# Patient Record
Sex: Female | Born: 1980 | Race: White | Hispanic: No | Marital: Married | Smoking: Never smoker
Health system: Southern US, Community
[De-identification: ages and names within clinical notes are randomized; demographics above are authoritative.]

## PROBLEM LIST (undated history)

## (undated) DIAGNOSIS — R112 Nausea with vomiting, unspecified: Secondary | ICD-10-CM

## (undated) DIAGNOSIS — Z9889 Other specified postprocedural states: Secondary | ICD-10-CM

## (undated) DIAGNOSIS — K429 Umbilical hernia without obstruction or gangrene: Secondary | ICD-10-CM

## (undated) DIAGNOSIS — F419 Anxiety disorder, unspecified: Secondary | ICD-10-CM

---

## 2008-10-13 ENCOUNTER — Encounter: Payer: Self-pay | Admitting: Obstetrics and Gynecology

## 2008-11-04 ENCOUNTER — Encounter: Payer: Self-pay | Admitting: Pediatric Cardiology

## 2009-01-12 ENCOUNTER — Encounter: Payer: Self-pay | Admitting: Maternal & Fetal Medicine

## 2009-02-05 ENCOUNTER — Encounter: Payer: Self-pay | Admitting: Obstetrics and Gynecology

## 2009-02-26 ENCOUNTER — Inpatient Hospital Stay: Payer: Self-pay

## 2010-11-13 ENCOUNTER — Inpatient Hospital Stay: Payer: Self-pay

## 2016-04-26 ENCOUNTER — Other Ambulatory Visit: Payer: Self-pay | Admitting: Medical Oncology

## 2016-04-26 DIAGNOSIS — R102 Pelvic and perineal pain: Secondary | ICD-10-CM

## 2016-04-29 ENCOUNTER — Ambulatory Visit
Admission: RE | Admit: 2016-04-29 | Discharge: 2016-04-29 | Disposition: A | Discharge status: Home / Self Care | Payer: 59 | Source: Ambulatory Visit | Attending: Medical Oncology | Admitting: Medical Oncology

## 2016-04-29 DIAGNOSIS — N83201 Unspecified ovarian cyst, right side: Secondary | ICD-10-CM | POA: Insufficient documentation

## 2016-04-29 DIAGNOSIS — D259 Leiomyoma of uterus, unspecified: Secondary | ICD-10-CM | POA: Diagnosis not present

## 2016-04-29 DIAGNOSIS — R102 Pelvic and perineal pain: Secondary | ICD-10-CM | POA: Diagnosis present

## 2016-09-05 HISTORY — PX: EYE SURGERY: SHX253

## 2018-07-13 ENCOUNTER — Encounter: Payer: Self-pay | Admitting: *Deleted

## 2018-07-13 ENCOUNTER — Other Ambulatory Visit: Payer: Self-pay

## 2018-07-13 ENCOUNTER — Encounter
Admission: RE | Admit: 2018-07-13 | Discharge: 2018-07-13 | Disposition: A | Discharge status: Home / Self Care | Payer: BLUE CROSS/BLUE SHIELD | Source: Ambulatory Visit | Attending: Obstetrics and Gynecology | Admitting: Obstetrics and Gynecology

## 2018-07-13 HISTORY — DX: Nausea with vomiting, unspecified: Z98.890

## 2018-07-13 HISTORY — DX: Umbilical hernia without obstruction or gangrene: K42.9

## 2018-07-13 HISTORY — DX: Nausea with vomiting, unspecified: R11.2

## 2018-07-13 NOTE — H&P (Signed)
Patient ID: Meghan Jacobson is a 37 y.o. female presenting with Pre Op Consulting  on 07/12/2018  HPI: 1. Postcoital bleeding:  - worsening bleeding over last 6 months, this month heavy clots, enough to stop intercourse. Improved this month. - Regular menses cycles, q23-25 days, heavy and painful - last pap 2019wnl- no hx of abnormal - Also dyspareunia, worsening over time. Feels like he is bumping something.  2. AUB: shortened cycles with heavy bleeding in first two days - bleeding through pads in <1hr, limited ability to leave the house  - condoms for contraception - 3cm fibroid noted on ultrasound - hx of limiting SE with OCPs - hx of IUD x3 with limiting side effects - nipple soreness and cystic acne - desires hysterectomy  4. Pelvic pain: - deep cramping - worse AFTER her period - Right lower quadrant pain  EMBx collected today and pending Last pap smear 3/19  Past Medical History:  has a past medical history of Strabismus and Umbilical hernia.  Past Surgical History:  has a past surgical history that includes strabismus eye surgery (1982~); wisdom teeth (2015); strabismus surgery w/placement adjustable suture (Left, 01/09/2017); strabismus surgery 2 horizontal muscles (Left, 01/09/2017); Eye surgery; and strabismus surgery superior oblique muscle (Left, 03/27/2017). Family History: family history includes High blood pressure (Hypertension) in her mother. Social History:  reports that she has never smoked. She has never used smokeless tobacco. She reports that she does not drink alcohol or use drugs. OB/GYN History:          OB History    Gravida  3   Para  3   Term  3   Preterm      AB      Living  3     SAB      TAB      Ectopic      Molar      Multiple      Live Births             Allergies: has No Known Allergies. Medications: No current outpatient medications on file.   Review of Systems: No SOB, no palpitations or chest pain, no  new lower extremity edema, no nausea or vomiting or bowel or bladder complaints. See HPI for gyn specific ROS.   Exam:   BP 110/66   Pulse 55   Ht 175.3 cm (5\' 9" )   Wt 71.3 kg (157 lb 3.2 oz)   BMI 23.21 kg/m   General: Patient is well-groomed, well-nourished, appears stated age in no acute distress  HEENT: head is atraumatic and normocephalic, trachea is midline, neck is supple with no palpable nodules  CV: Regular rhythm and normal heart rate, no murmur  Pulm: Clear to auscultation throughout lung fields with no wheezing, crackles, or rhonchi. No increased work of breathing  Abdomen: soft , no mass, non-tender, no rebound tenderness, no hepatomegaly  Pelvic: tanner stage 5 ,              External genitalia: vulva /labia no lesions             Urethra: no prolapse             Vagina: normal physiologic d/c, laxity in vaginal walls             Cervix: no lesions, no cervical motion tenderness, good descent             Uterus: normal size shape and contour, non-tender  Adnexa: no mass,  non-tender               Rectovaginal: External wnl   Impression:   The encounter diagnosis was Excessive or frequent menstruation.    Plan:    Patient returns for a preoperative discussion regarding her plans to proceed with surgical treatment of her pain and bleeding by total laparoscopic hysterectomy with bilateral salpingectomy  procedure. We will perform a cystoscopy to evaluate the urinary tract after the procedure.   The patient and I discussed the technical aspects of the procedure including the potential for risks and complications. These include but are not limited to the risk of infection requiring post-operative antibiotics or further procedures. We talked about the risk of injury to adjacent organs including bladder, bowel, ureter, blood vessels or nerves. We talked about the need to convert to an open incision. We talked about the possible need  for blood transfusion. We talked aboutpostop complications such asthromboembolic or cardiopulmonary complications. All of her questions were answered.  Her preoperative exam was completed and the appropriate consents were signed. She is scheduled to undergo this procedure in the near future.  Specific Peri-operative Considerations:  - Consent: obtained today - Health Maintenance: up todate - Labs: CBC, CMP preoperatively - Studies: EKG, CXR preoperatively - Bowel Preparation: None required - Abx:  Ancef 2g - VTE ppx: SCDs perioperatively - Glucose Protocol: n/a - Beta-blockade: n/a

## 2018-07-13 NOTE — Patient Instructions (Signed)
Your procedure is scheduled on: 07-20-18 FRIDAY Report to Same Day Surgery 2nd floor medical mall [facility] Entrance-take elevator on left to 2nd floor.  Check in with surgery information desk.) To find out your arrival time please call [phone] between 1PM - 3PM on 07-19-18 THURSDAY  Remember: Instructions that are not followed completely may result in serious medical risk, up to and including death, or upon the discretion of your surgeon and anesthesiologist your surgery may need to be rescheduled.    _x___ 1. Do not eat food after midnight the night before your procedure. NO GUM OR CANDY AFTER MIDNIGHT. You may drink clear liquids up to 2 hours before you are scheduled to arrive at the hospital for your procedure.  Do not drink clear liquids within 2 hours of your scheduled arrival to the hospital.  Clear liquids include  --Water or Apple juice without pulp  --Clear carbohydrate beverage such as ClearFast or Gatorade  --Black Coffee or Clear Tea (No milk, no creamers, do not add anything to the coffee or Tea   ____Ensure clear carbohydrate drink on the way to the hospital for bariatric patients  ____Ensure clear carbohydrate drink 3 hours before surgery for Dr Dwyane Luo patients if physician instructed.     __x__ 2. No Alcohol for 24 hours before or after surgery.   __x__3. No Smoking or e-cigarettes for 24 prior to surgery.  Do not use any chewable tobacco products for at least 6 hour prior to surgery   ____  4. Bring all medications with you on the day of surgery if instructed.    __x__ 5. Notify your doctor if there is any change in your medical condition     (cold, fever, infections).    x___6. On the morning of surgery brush your teeth with toothpaste and water.  You may rinse your mouth with mouth wash if you wish.  Do not swallow any toothpaste or mouthwash.   Do not wear jewelry, make-up, hairpins, clips or nail polish.  Do not wear lotions, powders, or perfumes.  You may wear deodorant.  Do not shave 48 hours prior to surgery. Men may shave face and neck.  Do not bring valuables to the hospital.    [facility] is not responsible for any belongings or valuables.               Contacts, dentures or bridgework may not be worn into surgery.  Leave your suitcase in the car. After surgery it may be brought to your room.  For patients admitted to the hospital, discharge time is determined by your treatment team.  _  Patients discharged the day of surgery will not be allowed to drive home.  You will need someone to drive you home and stay with you the night of your procedure.    Please read over the following fact sheets that you were given:   [facility] Preparing for Surgery   ____ Take anti-hypertensive listed below, cardiac, seizure, asthma, anti-reflux and psychiatric medicines. These include:  1. NONE  2.  3.  4.  5.  6.  ____Fleets enema or Magnesium Citrate as directed.   _x___ Use CHG Soap or sage wipes as directed on instruction sheet   ____ Use inhalers on the day of surgery and bring to hospital day of surgery  ____ Stop Metformin and Janumet 2 days prior to surgery.    ____ Take 1/2 of usual insulin dose the night before surgery and none on  the morning surgery.   ____ Follow recommendations from Cardiologist, Pulmonologist or PCP regarding stopping Aspirin, Coumadin, Plavix ,Eliquis, Effient, or Pradaxa, and Pletal.  X____Stop Anti-inflammatories such as Advil, Aleve, Ibuprofen, Motrin, Naproxen, Naprosyn, Goodies powders or aspirin products NOW-OK to take Tylenol    ____ Stop supplements until after surgery.    ____ Bring C-Pap to the hospital.

## 2018-07-16 ENCOUNTER — Encounter
Admission: RE | Admit: 2018-07-16 | Discharge: 2018-07-16 | Disposition: A | Discharge status: Home / Self Care | Payer: BLUE CROSS/BLUE SHIELD | Source: Ambulatory Visit | Attending: Obstetrics and Gynecology | Admitting: Obstetrics and Gynecology

## 2018-07-16 DIAGNOSIS — N920 Excessive and frequent menstruation with regular cycle: Secondary | ICD-10-CM | POA: Diagnosis not present

## 2018-07-16 DIAGNOSIS — Z01812 Encounter for preprocedural laboratory examination: Secondary | ICD-10-CM | POA: Insufficient documentation

## 2018-07-16 LAB — BASIC METABOLIC PANEL
ANION GAP: 8 (ref 5–15)
BUN: 7 mg/dL (ref 6–20)
CHLORIDE: 104 mmol/L (ref 98–111)
CO2: 26 mmol/L (ref 22–32)
Calcium: 8.6 mg/dL — ABNORMAL LOW (ref 8.9–10.3)
Creatinine, Ser: 0.6 mg/dL (ref 0.44–1.00)
GFR calc Af Amer: >60 mL/min (ref >60)
GLUCOSE: 84 mg/dL (ref 70–99)
POTASSIUM: 4.4 mmol/L (ref 3.5–5.1)
Sodium: 138 mmol/L (ref 135–145)

## 2018-07-16 LAB — CBC
HEMATOCRIT: 40.2 % (ref 36.0–46.0)
HEMOGLOBIN: 13.1 g/dL (ref 12.0–15.0)
MCH: 29.7 pg (ref 26.0–34.0)
MCHC: 32.6 g/dL (ref 30.0–36.0)
MCV: 91.2 fL (ref 80.0–100.0)
Platelets: 237 10*3/uL (ref 150–400)
RBC: 4.41 MIL/uL (ref 3.87–5.11)
RDW: 13.1 % (ref 11.5–15.5)
WBC: 4.8 10*3/uL (ref 4.0–10.5)
nRBC: 0 % (ref 0.0–0.2)

## 2018-07-16 LAB — TYPE AND SCREEN
ABO/RH(D): O POS
ANTIBODY SCREEN: NEGATIVE

## 2018-07-19 MED ORDER — CEFAZOLIN SODIUM-DEXTROSE 2-4 GM/100ML-% IV SOLN
2.0000 g | INTRAVENOUS | Status: AC
  Administered 2018-07-20: 2 g via INTRAVENOUS

## 2018-07-20 ENCOUNTER — Ambulatory Visit
Admission: RE | Admit: 2018-07-20 | Discharge: 2018-07-20 | Disposition: A | Discharge status: Home / Self Care | Payer: BLUE CROSS/BLUE SHIELD | Source: Ambulatory Visit | Attending: Obstetrics and Gynecology | Admitting: Obstetrics and Gynecology

## 2018-07-20 ENCOUNTER — Encounter: Payer: Self-pay | Admitting: *Deleted

## 2018-07-20 ENCOUNTER — Encounter
Admission: RE | Disposition: A | Discharge status: Home / Self Care | Payer: Self-pay | Source: Ambulatory Visit | Attending: Obstetrics and Gynecology

## 2018-07-20 ENCOUNTER — Ambulatory Visit: Payer: BLUE CROSS/BLUE SHIELD | Admitting: Anesthesiology

## 2018-07-20 DIAGNOSIS — N930 Postcoital and contact bleeding: Secondary | ICD-10-CM | POA: Diagnosis not present

## 2018-07-20 DIAGNOSIS — N920 Excessive and frequent menstruation with regular cycle: Secondary | ICD-10-CM | POA: Insufficient documentation

## 2018-07-20 DIAGNOSIS — N838 Other noninflammatory disorders of ovary, fallopian tube and broad ligament: Secondary | ICD-10-CM | POA: Insufficient documentation

## 2018-07-20 DIAGNOSIS — Z8249 Family history of ischemic heart disease and other diseases of the circulatory system: Secondary | ICD-10-CM | POA: Diagnosis not present

## 2018-07-20 DIAGNOSIS — D251 Intramural leiomyoma of uterus: Secondary | ICD-10-CM | POA: Insufficient documentation

## 2018-07-20 DIAGNOSIS — D252 Subserosal leiomyoma of uterus: Secondary | ICD-10-CM | POA: Insufficient documentation

## 2018-07-20 HISTORY — PX: LAPAROSCOPIC HYSTERECTOMY: SHX1926

## 2018-07-20 HISTORY — PX: LAPAROSCOPIC BILATERAL SALPINGECTOMY: SHX5889

## 2018-07-20 HISTORY — DX: Anxiety disorder, unspecified: F41.9

## 2018-07-20 LAB — POCT PREGNANCY, URINE: Preg Test, Ur: NEGATIVE

## 2018-07-20 LAB — ABO/RH: ABO/RH(D): O POS

## 2018-07-20 SURGERY — HYSTERECTOMY, TOTAL, LAPAROSCOPIC
Anesthesia: General

## 2018-07-20 MED ORDER — FENTANYL CITRATE (PF) 100 MCG/2ML IJ SOLN
INTRAMUSCULAR | Status: DC | PRN
  Administered 2018-07-20 (×3): 50 ug via INTRAVENOUS

## 2018-07-20 MED ORDER — FAMOTIDINE 20 MG PO TABS
ORAL_TABLET | ORAL | Status: AC
  Filled 2018-07-20: qty 1

## 2018-07-20 MED ORDER — GABAPENTIN 300 MG PO CAPS
ORAL_CAPSULE | ORAL | Status: AC
  Administered 2018-07-20: 300 mg via ORAL
  Filled 2018-07-20: qty 1

## 2018-07-20 MED ORDER — ONDANSETRON HCL 4 MG/2ML IJ SOLN
INTRAMUSCULAR | Status: DC | PRN
  Administered 2018-07-20: 4 mg via INTRAVENOUS

## 2018-07-20 MED ORDER — GLYCOPYRROLATE 0.2 MG/ML IJ SOLN
INTRAMUSCULAR | Status: AC
  Filled 2018-07-20: qty 1

## 2018-07-20 MED ORDER — LACTATED RINGERS IV SOLN
INTRAVENOUS | Status: DC
  Administered 2018-07-20: 50 mL/h via INTRAVENOUS

## 2018-07-20 MED ORDER — DEXAMETHASONE SODIUM PHOSPHATE 10 MG/ML IJ SOLN
INTRAMUSCULAR | Status: AC
  Filled 2018-07-20: qty 1

## 2018-07-20 MED ORDER — BUPIVACAINE HCL 0.5 % IJ SOLN
INTRAMUSCULAR | Status: DC | PRN
  Administered 2018-07-20: 10 mL

## 2018-07-20 MED ORDER — CELECOXIB 200 MG PO CAPS
ORAL_CAPSULE | ORAL | Status: AC
  Administered 2018-07-20: 400 mg via ORAL
  Filled 2018-07-20: qty 2

## 2018-07-20 MED ORDER — FAMOTIDINE 20 MG PO TABS: 20.0000 mg | ORAL_TABLET | Freq: Once | ORAL | Status: DC

## 2018-07-20 MED ORDER — SUCCINYLCHOLINE CHLORIDE 20 MG/ML IJ SOLN
INTRAMUSCULAR | Status: AC
  Filled 2018-07-20: qty 2

## 2018-07-20 MED ORDER — MIDAZOLAM HCL 2 MG/2ML IJ SOLN
INTRAMUSCULAR | Status: AC
  Filled 2018-07-20: qty 2

## 2018-07-20 MED ORDER — MIDAZOLAM HCL 2 MG/2ML IJ SOLN
INTRAMUSCULAR | Status: DC | PRN
  Administered 2018-07-20: 2 mg via INTRAVENOUS

## 2018-07-20 MED ORDER — FENTANYL CITRATE (PF) 100 MCG/2ML IJ SOLN: 25.0000 ug | INTRAMUSCULAR | Status: DC | PRN

## 2018-07-20 MED ORDER — DOCUSATE SODIUM 100 MG PO CAPS: 100.0000 mg | ORAL_CAPSULE | Freq: Two times a day (BID) | ORAL | 0 refills | Status: DC

## 2018-07-20 MED ORDER — ONDANSETRON HCL 4 MG/2ML IJ SOLN: 4.0000 mg | Freq: Once | INTRAMUSCULAR | Status: DC | PRN

## 2018-07-20 MED ORDER — IBUPROFEN 800 MG PO TABS: 800.0000 mg | ORAL_TABLET | Freq: Three times a day (TID) | ORAL | 1 refills | Status: AC

## 2018-07-20 MED ORDER — SUGAMMADEX SODIUM 200 MG/2ML IV SOLN
INTRAVENOUS | Status: AC
  Filled 2018-07-20: qty 2

## 2018-07-20 MED ORDER — ROCURONIUM BROMIDE 50 MG/5ML IV SOLN
INTRAVENOUS | Status: AC
  Filled 2018-07-20: qty 1

## 2018-07-20 MED ORDER — LIDOCAINE HCL (PF) 2 % IJ SOLN
INTRAMUSCULAR | Status: AC
  Filled 2018-07-20: qty 10

## 2018-07-20 MED ORDER — OXYCODONE HCL 5 MG PO CAPS: 5.0000 mg | ORAL_CAPSULE | Freq: Four times a day (QID) | ORAL | 0 refills | Status: DC | PRN

## 2018-07-20 MED ORDER — DEXAMETHASONE SODIUM PHOSPHATE 10 MG/ML IJ SOLN
INTRAMUSCULAR | Status: DC | PRN
  Administered 2018-07-20: 10 mg via INTRAVENOUS

## 2018-07-20 MED ORDER — ACETAMINOPHEN 500 MG PO TABS
ORAL_TABLET | ORAL | Status: AC
  Administered 2018-07-20: 1000 mg via ORAL
  Filled 2018-07-20: qty 2

## 2018-07-20 MED ORDER — BUPIVACAINE HCL (PF) 0.5 % IJ SOLN
INTRAMUSCULAR | Status: AC
  Filled 2018-07-20: qty 30

## 2018-07-20 MED ORDER — ACETAMINOPHEN 500 MG PO TABS: 1000.0000 mg | ORAL_TABLET | Freq: Four times a day (QID) | ORAL | 0 refills | Status: AC

## 2018-07-20 MED ORDER — ROCURONIUM BROMIDE 100 MG/10ML IV SOLN
INTRAVENOUS | Status: DC | PRN
  Administered 2018-07-20: 20 mg via INTRAVENOUS
  Administered 2018-07-20: 50 mg via INTRAVENOUS

## 2018-07-20 MED ORDER — IBUPROFEN 800 MG PO TABS
800.0000 mg | ORAL_TABLET | Freq: Three times a day (TID) | ORAL | Status: DC | PRN
  Administered 2018-07-20: 800 mg via ORAL
  Filled 2018-07-20 (×2): qty 1

## 2018-07-20 MED ORDER — SUGAMMADEX SODIUM 200 MG/2ML IV SOLN
INTRAVENOUS | Status: DC | PRN
  Administered 2018-07-20: 200 mg via INTRAVENOUS

## 2018-07-20 MED ORDER — PROPOFOL 10 MG/ML IV BOLUS
INTRAVENOUS | Status: DC | PRN
  Administered 2018-07-20: 160 mg via INTRAVENOUS

## 2018-07-20 MED ORDER — LIDOCAINE HCL (CARDIAC) PF 100 MG/5ML IV SOSY
PREFILLED_SYRINGE | INTRAVENOUS | Status: DC | PRN
  Administered 2018-07-20: 50 mg via INTRAVENOUS

## 2018-07-20 MED ORDER — ACETAMINOPHEN 500 MG PO TABS
1000.0000 mg | ORAL_TABLET | ORAL | Status: AC
  Administered 2018-07-20: 1000 mg via ORAL

## 2018-07-20 MED ORDER — IBUPROFEN 800 MG PO TABS
ORAL_TABLET | ORAL | Status: AC
  Filled 2018-07-20: qty 1

## 2018-07-20 MED ORDER — ONDANSETRON HCL 4 MG/2ML IJ SOLN
INTRAMUSCULAR | Status: AC
  Filled 2018-07-20: qty 2

## 2018-07-20 MED ORDER — GABAPENTIN 800 MG PO TABS: 800.0000 mg | ORAL_TABLET | Freq: Every day | ORAL | 0 refills | Status: AC

## 2018-07-20 MED ORDER — LACTATED RINGERS IV SOLN
INTRAVENOUS | Status: DC
  Administered 2018-07-20: 08:00:00 via INTRAVENOUS

## 2018-07-20 MED ORDER — PROPOFOL 10 MG/ML IV BOLUS
INTRAVENOUS | Status: AC
  Filled 2018-07-20: qty 20

## 2018-07-20 MED ORDER — GABAPENTIN 300 MG PO CAPS
300.0000 mg | ORAL_CAPSULE | ORAL | Status: AC
  Administered 2018-07-20: 300 mg via ORAL

## 2018-07-20 MED ORDER — CELECOXIB 200 MG PO CAPS
400.0000 mg | ORAL_CAPSULE | ORAL | Status: AC
  Administered 2018-07-20: 400 mg via ORAL

## 2018-07-20 MED ORDER — GLYCOPYRROLATE 0.2 MG/ML IJ SOLN
INTRAMUSCULAR | Status: DC | PRN
  Administered 2018-07-20: 0.2 mg via INTRAVENOUS

## 2018-07-20 MED ORDER — FENTANYL CITRATE (PF) 250 MCG/5ML IJ SOLN
INTRAMUSCULAR | Status: AC
  Filled 2018-07-20: qty 5

## 2018-07-20 MED ORDER — CEFAZOLIN SODIUM-DEXTROSE 2-4 GM/100ML-% IV SOLN
INTRAVENOUS | Status: AC
  Filled 2018-07-20: qty 100

## 2018-07-20 SURGICAL SUPPLY — 65 items
BAG URINE DRAINAGE (UROLOGICAL SUPPLIES) ×8 IMPLANT
BLADE SURG SZ11 CARB STEEL (BLADE) ×4 IMPLANT
CATH FOLEY 2WAY  5CC 16FR (CATHETERS) ×2
CATH ROBINSON RED A/P 16FR (CATHETERS) ×4 IMPLANT
CATH URTH 16FR FL 2W BLN LF (CATHETERS) ×2 IMPLANT
CHLORAPREP W/TINT 26ML (MISCELLANEOUS) ×4 IMPLANT
CLOSURE WOUND 1/4X4 (GAUZE/BANDAGES/DRESSINGS) ×1
CORD MONOPOLAR M/FML 12FT (MISCELLANEOUS) ×4 IMPLANT
COUNTER NEEDLE 20/40 LG (NEEDLE) ×4 IMPLANT
COVER LIGHT HANDLE STERIS (MISCELLANEOUS) ×8 IMPLANT
COVER WAND RF STERILE (DRAPES) ×4 IMPLANT
DERMABOND ADVANCED (GAUZE/BANDAGES/DRESSINGS) ×2
DERMABOND ADVANCED .7 DNX12 (GAUZE/BANDAGES/DRESSINGS) ×2 IMPLANT
DEVICE SUTURE ENDOST 10MM (ENDOMECHANICALS) ×8 IMPLANT
DRAPE GENERAL ENDO 106X123.5 (DRAPES) ×4 IMPLANT
DRAPE STERI POUCH LG 24X46 STR (DRAPES) ×4 IMPLANT
DRSG TEGADERM 2-3/8X2-3/4 SM (GAUZE/BANDAGES/DRESSINGS) ×12 IMPLANT
GLOVE BIO SURGEON STRL SZ7 (GLOVE) ×12 IMPLANT
GLOVE INDICATOR 7.5 STRL GRN (GLOVE) ×4 IMPLANT
GOWN STRL REUS W/ TWL LRG LVL3 (GOWN DISPOSABLE) ×4 IMPLANT
GOWN STRL REUS W/ TWL XL LVL3 (GOWN DISPOSABLE) ×2 IMPLANT
GOWN STRL REUS W/TWL LRG LVL3 (GOWN DISPOSABLE) ×4
GOWN STRL REUS W/TWL XL LVL3 (GOWN DISPOSABLE) ×2
GRASPER SUT TROCAR 14GX15 (MISCELLANEOUS) ×4 IMPLANT
IRRIGATION STRYKERFLOW (MISCELLANEOUS) ×2 IMPLANT
IRRIGATOR STRYKERFLOW (MISCELLANEOUS) ×4
IV LACTATED RINGERS 1000ML (IV SOLUTION) ×4 IMPLANT
IV NS 1000ML (IV SOLUTION) ×2
IV NS 1000ML BAXH (IV SOLUTION) ×2 IMPLANT
KIT PINK PAD W/HEAD ARE REST (MISCELLANEOUS) ×4
KIT PINK PAD W/HEAD ARM REST (MISCELLANEOUS) ×2 IMPLANT
KIT TURNOVER CYSTO (KITS) ×4 IMPLANT
LABEL OR SOLS (LABEL) ×4 IMPLANT
LIGASURE VESSEL 5MM BLUNT TIP (ELECTROSURGICAL) ×4 IMPLANT
MANIPULATOR VCARE LG CRV RETR (MISCELLANEOUS) IMPLANT
MANIPULATOR VCARE SML CRV RETR (MISCELLANEOUS) IMPLANT
MANIPULATOR VCARE STD CRV RETR (MISCELLANEOUS) ×4 IMPLANT
NS IRRIG 500ML POUR BTL (IV SOLUTION) ×4 IMPLANT
OCCLUDER COLPOPNEUMO (BALLOONS) ×4 IMPLANT
PACK GYN LAPAROSCOPIC (MISCELLANEOUS) ×4 IMPLANT
PAD OB MATERNITY 4.3X12.25 (PERSONAL CARE ITEMS) ×4 IMPLANT
PAD PREP 24X41 OB/GYN DISP (PERSONAL CARE ITEMS) ×4 IMPLANT
POUCH SPECIMEN RETRIEVAL 10MM (ENDOMECHANICALS) IMPLANT
SCISSORS METZENBAUM CVD 33 (INSTRUMENTS) ×4 IMPLANT
SET CYSTO W/LG BORE CLAMP LF (SET/KITS/TRAYS/PACK) ×4 IMPLANT
SLEEVE ENDOPATH XCEL 5M (ENDOMECHANICALS) ×4 IMPLANT
SPONGE GAUZE 2X2 8PLY STER LF (GAUZE/BANDAGES/DRESSINGS) ×2
SPONGE GAUZE 2X2 8PLY STRL LF (GAUZE/BANDAGES/DRESSINGS) ×6 IMPLANT
STRIP CLOSURE SKIN 1/4X4 (GAUZE/BANDAGES/DRESSINGS) ×3 IMPLANT
SURGILUBE 2OZ TUBE FLIPTOP (MISCELLANEOUS) ×4 IMPLANT
SUT ENDO VLOC 180-0-8IN (SUTURE) ×8 IMPLANT
SUT MNCRL 4-0 (SUTURE) ×2
SUT MNCRL 4-0 27XMFL (SUTURE) ×2
SUT MNCRL AB 4-0 PS2 18 (SUTURE) ×4 IMPLANT
SUT VIC AB 0 CT1 36 (SUTURE) ×8 IMPLANT
SUT VIC AB 2-0 UR6 27 (SUTURE) ×4 IMPLANT
SUT VIC AB 4-0 SH 27 (SUTURE) ×2
SUT VIC AB 4-0 SH 27XANBCTRL (SUTURE) ×2 IMPLANT
SUTURE MNCRL 4-0 27XMF (SUTURE) ×2 IMPLANT
SYR 10ML LL (SYRINGE) ×4 IMPLANT
SYR 50ML LL SCALE MARK (SYRINGE) ×4 IMPLANT
TROCAR ENDO BLADELESS 11MM (ENDOMECHANICALS) IMPLANT
TROCAR XCEL NON-BLD 5MMX100MML (ENDOMECHANICALS) ×4 IMPLANT
TUBING INSUF HEATED (TUBING) ×4 IMPLANT
TUBING INSUFFLATION (TUBING) ×4 IMPLANT

## 2018-07-20 NOTE — Anesthesia Preprocedure Evaluation (Signed)
Anesthesia Evaluation  Patient identified by MRN, date of birth, ID band Patient awake    Reviewed: Allergy & Precautions, NPO status , Patient's Chart, lab work & pertinent test results  History of Anesthesia Complications (+) PONV and history of anesthetic complications  Airway Mallampati: II  TM Distance: >3 FB     Dental   Pulmonary neg pulmonary ROS,    Pulmonary exam normal        Cardiovascular negative cardio ROS Normal cardiovascular exam     Neuro/Psych Anxiety negative neurological ROS     GI/Hepatic negative GI ROS, Neg liver ROS,   Endo/Other  negative endocrine ROS  Renal/GU negative Renal ROS  Female GU complaint     Musculoskeletal negative musculoskeletal ROS (+)   Abdominal Normal abdominal exam  (+)   Peds negative pediatric ROS (+)  Hematology negative hematology ROS (+)   Anesthesia Other Findings   Reproductive/Obstetrics                             Anesthesia Physical Anesthesia Plan  ASA: I  Anesthesia Plan: General   Post-op Pain Management:    Induction: Intravenous  PONV Risk Score and Plan:   Airway Management Planned: Oral ETT  Additional Equipment:   Intra-op Plan:   Post-operative Plan: Extubation in OR  Informed Consent: I have reviewed the patients History and Physical, chart, labs and discussed the procedure including the risks, benefits and alternatives for the proposed anesthesia with the patient or authorized representative who has indicated his/her understanding and acceptance.   Dental advisory given  Plan Discussed with: CRNA and Surgeon  Anesthesia Plan Comments:         Anesthesia Quick Evaluation

## 2018-07-20 NOTE — Interval H&P Note (Signed)
History and Physical Interval Note:  07/20/2018 7:29 AM  Meghan Jacobson  has presented today for surgery, with the diagnosis of menorrhagia, pelvic pain  The various methods of treatment have been discussed with the patient and family. After consideration of risks, benefits and other options for treatment, the patient has consented to  Procedure(s): HYSTERECTOMY TOTAL LAPAROSCOPIC (N/A) LAPAROSCOPIC BILATERAL SALPINGECTOMY (Bilateral) CYSTOSCOPY (N/A) as a surgical intervention .  The patient's history has been reviewed, patient examined, no change in status, stable for surgery.  I have reviewed the patient's chart and labs.  Questions were answered to the patient's satisfaction.    Her endometrial biopsy showed chronic endometritis. Pap smear up to date.    Benjaman Kindler

## 2018-07-20 NOTE — OR Nursing (Deleted)
Patient reports taking her antibiotic Monday through Wednesday but did not take it at all yesterday because she felt it was making her nauseated. Dr. Diamantina Providence notified and UA ordered. UA sent stat to lab.

## 2018-07-20 NOTE — Anesthesia Procedure Notes (Signed)
Procedure Name: Intubation Performed by: Rolla Plate, CRNA Pre-anesthesia Checklist: Patient identified, Patient being monitored, Timeout performed, Emergency Drugs available and Suction available Patient Re-evaluated:Patient Re-evaluated prior to induction Oxygen Delivery Method: Circle system utilized Preoxygenation: Pre-oxygenation with 100% oxygen Induction Type: IV induction Ventilation: Mask ventilation without difficulty Laryngoscope Size: Mac and 3 Grade View: Grade I Tube type: Oral Tube size: 7.0 mm Number of attempts: 1 Airway Equipment and Method: Stylet Placement Confirmation: ETT inserted through vocal cords under direct vision,  positive ETCO2 and breath sounds checked- equal and bilateral Secured at: 21 cm Tube secured with: Tape Dental Injury: Teeth and Oropharynx as per pre-operative assessment

## 2018-07-20 NOTE — Op Note (Signed)
[facility] See PROCEDURE DATE: 07/20/2018  PREOPERATIVE DIAGNOSIS: Pelvic pain, abnormal uterine bleeding POSTOPERATIVE DIAGNOSIS: The same PROCEDURE: Total laparoscopic hysterectomy, bilateral salpingectomy, cystoscopy SURGEON:  Dr. Benjaman Kindler ASSISTANT: Dr. Vikki Ports Ward Anesthesiologist:  Anesthesiologist: Alvin Critchley, MD CRNA: Rolla Plate, CRNA; Hedda Slade, CRNA  INDICATIONS: 37 y.o. here for definitive surgical management secondary to the indications listed under preoperative diagnoses; please see preoperative note for further details.  Risks of surgery were discussed with the patient including but not limited to: bleeding which may require transfusion or reoperation; infection which may require antibiotics; injury to bowel, bladder, ureters or other surrounding organs; need for additional procedures; thromboembolic phenomenon, incisional problems and other postoperative/anesthesia complications. Written informed consent was obtained.    FINDINGS:  Bulbous uterus, normal tubes and ovaries. Normal cervix and vagina. Excessive stool burden that extended to the RLQ and cecum.   ANESTHESIA:    General INTRAVENOUS FLUIDS:600  ml ESTIMATED BLOOD LOSS:minima URINE OUTPUT: 75 ml   SPECIMENS: Uterus, cervix, bilateral fallopian tubes  COMPLICATIONS: None immediate  PROCEDURE IN DETAIL:  The patient received prophalactic intravenous antibiotics and had sequential compression devices applied to her lower extremities while in the preoperative area.  She was then taken to the operating room where general anesthesia was administered and was found to be adequate.  She was placed in the dorsal lithotomy position, and was prepped and draped in a sterile manner.  A formal time out was performed with all team members present and in agreement.  A V-care uterine manipulator was placed at this time.  A Foley catheter was inserted into her bladder and attached to constant drainage. Attention was  turned to the abdomen and 0.5% Marcaine infused subq. A 82mm umbilical incision was made with the scalpel.  The Optiview 5-mm trocar and sleeve were then advanced without difficulty with the laparoscope under direct visualization into the abdomen.  The abdomen was then insufflated with carbon dioxide gas and adequate pneumoperitoneum was obtained.  A survey of the patient's pelvis and abdomen revealed the findings above.  Bilateral lower quadrant ports (5 mm on the right and 11 mm on the left) were then placed under direct visualization.  The pelvis was then carefully examined.  Attention was turned to the fallopian tubes; these were freed from the underlying mesosalpinx and the uterine attachments using the Ligasure device.  The bilateral round and broad ligaments were then clamped and transected with the Ligasure device.  The uterine artery was then skeletonized and a bladder flap was created.  The ureters were noted to be safely away from the area of dissection.  The bladder was then bluntly dissected off the lower uterine segment.    At this point, attention was turned to the uterine vessels, which were clamped and cauterized using the Ligasure on the left, and then the right. After the uterine blood flow at the level of the internal os was controlled, both arteries were cut with the Ligasure.  Good hemostasis was noted overall.  The uterosacral and cardinal ligaments were clamped, cut and ligated bilaterally .  Attention was then turned to the cervicovaginal junction, and monopolar scissors were used to transect the cervix from the surrounding vagina using the ring of the V-care as a guide. This was done circumferentially allowing total hysterectomy.  The uterus was then removed from the vagina and the vaginal cuff incision was then closed with running V-loc suture.  Overall excellent hemostasis was noted.    Attention was returned to the abdomen.The ureters  were reexamined bilaterally and were pulsating  normally. The abdominal pressure was reduced and hemostasis was confirmed.   Intravenous floruoceine was administered, and cystoscopy showed bilateral ureteral jets.  No stitches were visualized in the bladder during cystoscopy.  The 46mm port fascia was closed with a vertical mattress with 0-Vicryl, using the cone closure system. A small hernia to the left of the umbilicus was closed with the 5 mm port at the umbilicus. All trocars were removed under direct visualization, and the abdomen was desufflated.  All skin incisions were closed with 4-0 Vicryl subcuticular stitches and Dermabond. The patient tolerated the procedures well.  All instruments, needles, and sponge counts were correct x 2. The patient was taken to the recovery room awake, extubated and in stable condition.

## 2018-07-20 NOTE — Anesthesia Post-op Follow-up Note (Signed)
Anesthesia QCDR form completed.        

## 2018-07-20 NOTE — Discharge Instructions (Addendum)
Discharge instructions after   total laparoscopic hysterectomy   For the next three days, take ibuprofen and acetaminophen on a schedule, every 8 hours. You can take them together or you can intersperse them, and take one every four hours. I also gave you gabapentin for nighttime, to help you sleep and also to control pain. Take gabapentin medicines at night for at least the next 3 nights. You also have a narcotic, oxycodone, to take as needed if the above medicines don't help.  Postop constipation is a major cause of pain. Stay well hydrated, walk as you tolerate, and take over the counter senna as well as stool softeners if you need them.    Signs and Symptoms to Report Call our office at [phone] if you have any of the following.    AMBULATORY SURGERY  DISCHARGE INSTRUCTIONS   1) The drugs that you were given will stay in your system until tomorrow so for the next 24 hours you should not:  A) Drive an automobile B) Make any legal decisions C) Drink any alcoholic beverage   2) You may resume regular meals tomorrow.  Today it is better to start with liquids and gradually work up to solid foods.  You may eat anything you prefer, but it is better to start with liquids, then soup and crackers, and gradually work up to solid foods.   3) Please notify your doctor immediately if you have any unusual bleeding, trouble breathing, redness and pain at the surgery site, drainage, fever, or pain not relieved by medication.    4) Additional Instructions:        Please contact your physician with any problems or Same Day Surgery at [phone], Monday through Friday 6 am to 4 pm, or [place] at [facility] number at [phone].   Fever over 100.4 degrees or higher  Severe stomach pain not relieved with pain medications  Bright red bleeding thats heavier than a period that does not slow with rest  To go the bathroom a lot (frequency), you cant hold your urine  (urgency), or it hurts when you empty your bladder (urinate)  Chest pain  Shortness of breath  Pain in the calves of your legs  Severe nausea and vomiting not relieved with anti-nausea medications  Signs of infection around your wounds, such as redness, hot to touch, swelling, green/yellow drainage (like pus), bad smelling discharge  Any concerns  What You Can Expect after Surgery  You may see some pink tinged, bloody fluid and bruising around the wound. This is normal.  You may notice shoulder and neck pain. This is caused by the gas used during surgery to expand your abdomen so your surgeon could get to the uterus easier.  You may have a sore throat because of the tube in your mouth during general anesthesia. This will go away in 2 to 3 days.  You may have some stomach cramps.  You may notice spotting on your panties.  You may have pain around the incision sites.   Activities after Your Discharge Follow these guidelines to help speed your recovery at home:  Do the coughing and deep breathing as you did in the hospital for 2 weeks. Use the small blue breathing device, called the incentive spirometer for 2 weeks.  Dont drive if you are in pain or taking narcotic pain medicine. You may drive when you can safely slam on the brakes, turn the wheel forcefully, and rotate your torso comfortably. This is typically  1-2 weeks. Practice in a parking lot or side street prior to attempting to drive regularly.   Ask others to help with household chores for 4 weeks.  Do not lift anything heavier that 10 pounds for 4-6 weeks. This includes pets, children, and groceries.  Dont do strenuous activities, exercises, or sports like vacuuming, tennis, squash, etc. until your doctor says it is safe to do so. ---Maintain pelvic rest for 8 weeks. This means nothing in the vagina or rectum at all (no douching, tampons, intercourse) for 8 weeks.   Walk as you feel able. Rest often since it may  take two or three weeks for your energy level to return to normal.   You may climb stairs  Avoid constipation:   -Eat fruits, vegetables, and whole grains. Eat small meals as your appetite will take time to return to normal.   -Drink 6 to 8 glasses of water each day unless your doctor has told you to limit your fluids.   -Use a laxative or stool softener as needed if constipation becomes a problem. You may take Miralax, metamucil, Citrucil, Colace, Senekot, FiberCon, etc. If this does not relieve the constipation, try two tablespoons of Milk Of Magnesia every 8 hours until your bowels move. See the blog post I am copying below.  You may shower. Gently wash the wounds with a mild soap and water. Pat dry.  Do not get in a hot tub, swimming pool, etc. for 6 weeks.  Do not use lotions, oils, powders on the wounds.  Do not douche, use tampons, or have sex until your doctor says it is okay.  Take your pain medicine when you need it. The medicine may not work as well if the pain is bad.  Take the medicines you were taking before surgery. Other medications you will need are pain medications and possibly constipation and nausea medications (Zofran).     Here is a helpful article from the website [employer], regarding constipation  Here are reasons why constipation occurs after surgery: 1) During the operation and in the recovery room, most people are given opioid pain medication, primarily through an IV, to treat moderate or severe pain. Intravenous opioids include morphine, Dilaudid and fentanyl. After surgery, patients are often prescribed opioid pain medication to take by mouth at home, including codeine, Vicodin, Norco, and Percocet. All of these medications cause constipation by slowing down the movement of your intestine. 2) Changes in your diet before surgery can be another culprit. It is common to get specific instructions to change how you normally eat or drink before your surgery,  like only having liquids the day before or not having anything to eat or drink after midnight the night before surgery. For this reason, temporary dehydration may occur. This, along with not eating or only having liquids, means that you are getting less fiber than usual. Both these factors contribute to constipation. 3) Changes in your diet after surgery can also contribute to the problem. Although many people dont have dietary restrictions after operations, being under anesthesia can make you lose your appetite for several hours and maybe even days. Some people can even have nausea or vomiting. Not eating or drinking normally means that you are not getting enough fiber and you can get dehydrated, both leading to constipation. 4) Lying in a bed more than usual--which happens before, during and after surgery--combined with the medications and diet changes, all work together to slow down your colon and make your poop turn to  rock.  No one likes to be constipated.  Lets face it, its not a pleasant feeling when you dont poop for days, then strain on the toilet to finally pass something large enough to cause damage. An ounce of prevention is worth a pound of cure, so: 1. Assume you will be constipated. 2. Plan and prepare accordingly. Post-surgery is one of those unique situations where the temporary use of laxatives can make a world of difference. Always consult with your doctor, and recognize that if you wait several days after surgery to take a laxative, the constipation might be too severe for these over-the-counter options. It is always important to discuss all medications you plan on taking with your doctor. Ask your doctor if you can start the laxative immediately after surgery. (YES PLEASE!)  Here are go-to post-surgery laxatives: Senna: Senna is an herb that acts as a stimulant laxative, meaning it increases the activity of the intestine to cause you to have a bowel movement. It comes in many  forms, but senna pills are easy to take and are sold over the counter at almost all pharmacies. Since opioid pain medications slow down the activity of the intestine, it makes sense to take a medication to help reverse that side effect. Long-term use of a stimulant laxative is not a good idea since it can make your colon lazy and not function properly; however, temporary use immediately after surgery is acceptable. In general, if you are able to eat a normal diet, taking senna soon after surgery works the best. Senna usually works within hours to produce a bowel movement, but this is less predictable when you are taking different medications after surgery. Try not to wait several days to start taking senna, as often it is too late by then. Just like with all medications or supplements, check with your doctor before starting new treatment.   Magnesium: Magnesium is an important mineral that our body needs. We get magnesium from some foods that we eat, especially foods that are high in fiber such as broccoli, almonds and whole grains. There are also magnesium-based medications used to treat constipation including milk of magnesia (magnesium hydroxide), magnesium citrate and magnesium oxide. They work by drawing water into the intestine, putting it into the class of osmotic laxatives. Magnesium products in low doses appear to be safe, but if taken in very large doses, can lead to problems such as irregular heartbeat, low blood pressure and even death. It can also affect other medications you might be taking, therefore it is important to discuss using magnesium with your physician and pharmacist before initiating therapy. Most over-the-counter magnesium laxatives work very well to help with the constipation related to surgery, but sometimes they work too well and lead to diarrhea. Make sure you are somewhere with easy access to a bathroom, just in case.   Bisacodyl: Bisacodyl (generic name) is sold under brand  names such as Dulcolax. Much like senna, it is a stimulant laxative, meaning it makes your intestines move more quickly to push out the stool. This is another good choice to start taking as soon as your doctor says you can take a laxative after surgery. It comes in pill form and as a suppository, which is a good choice for people who cannot or are not allowed to swallow pills. Studies have shown that it works as a laxative, but like most of these medications, you should use this on a short-term basis only.   Enema: Enemas strike fear in  many people, but FEAR NOT! Its nowhere near as big a deal as you may think. An enema is just a way to get some liquid into your rectum by placing a specially designed device through your anus. If you have never done one, it might seem like a painful, unpleasant, uncomfortable, complicated and lengthy procedure. But in reality, its simple, takes just a few seconds and is highly effective. The small ready-made bottles you buy at the pharmacy are much easier than the hose/large rubber container type. Those recommended positions illustrated in some instructions are generally not necessary to place the enema. Its very similar to the insertion of a tampon, requiring a slight squat. Some extra lubrication on the enemas tip (or on your anus) will make it a breeze. In certain cases, there is no substitute for a good enema. For example, if someone has not pooped for a few days, the beginning of the poop waiting to come out can become rock hard. Passing that hard stool can lead to much pain and problems like anal fissures. Inserting a little liquid to break up the rock-hard stool will help make its passage much easier. Enemas come with different liquids. Most come with saline, but there are also mineral oil options. You can also use warm water in the reusable enema containers. They all work. But since saline can sometimes be irritating, so try a mineral oil or water enema  instead.  Here are commonly recommended constipation medications that do not work well for post-surgery constipation: Docusate: Docusate (generic name) most commonly referred to as Colace (brand name) is not really a laxative, but is classified as a stool softener. Although this medication is commonly prescribed, it is not recommended for several reasons: 1) there is no good medical evidence that it works 2) even if it has an effect, which is very questionable, it is minimal and cannot combat the intestinal slowing caused by the opioid medications. Skip docusate to save money and space in your pillbox for something more effective.  PEG: Miralax (brand name) is basically a chemical called polyethylene glycol (PEG) and it has gained tremendous popularity as a laxative. This product is an osmotic laxative meaning it works by pulling water into the stool, making it softer. This is very similar to the action of natural fiber in foods and supplements. Therefore, the effect seen by this medication is not immediate, causing a bowel movement in a day or more. Is this medication strong enough to battle the constipation related to having an operation? Maybe for some people not prone to constipation. But for most people, other laxatives are better to prevent constipation after surgery.    I have also included a bowel regimen that might work for you in the long term. It takes a while to kick in, and I would start with 1 Tablespoon at a time for a week, and then increase by a tablespoon a week. It might take a month to notice a difference, but if it works it might help a lot.

## 2018-07-20 NOTE — Anesthesia Postprocedure Evaluation (Signed)
Anesthesia Post Note  Patient: Bellamy Rubey  Procedure(s) Performed: HYSTERECTOMY TOTAL LAPAROSCOPIC (N/A ) LAPAROSCOPIC BILATERAL SALPINGECTOMY (Bilateral )  Patient location during evaluation: PACU Anesthesia Type: General Level of consciousness: awake and alert and oriented Pain management: pain level controlled Vital Signs Assessment: post-procedure vital signs reviewed and stable Respiratory status: spontaneous breathing Cardiovascular status: blood pressure returned to baseline Anesthetic complications: no     Last Vitals:  Vitals:   07/20/18 1011 07/20/18 1204  BP:  100/66  Pulse: 69 65  Resp: 16 16  Temp: 37.2 C 36.8 C  SpO2: 100% 100%    Last Pain:  Vitals:   07/20/18 1204  TempSrc: Temporal  PainSc: 0-No pain                 Donyell Ding

## 2018-07-20 NOTE — Transfer of Care (Signed)
Immediate Anesthesia Transfer of Care Note  Patient: Meghan Jacobson  Procedure(s) Performed: HYSTERECTOMY TOTAL LAPAROSCOPIC (N/A ) LAPAROSCOPIC BILATERAL SALPINGECTOMY (Bilateral )  Patient Location: PACU  Anesthesia Type:General  Level of Consciousness: sedated  Airway & Oxygen Therapy: Patient Spontanous Breathing and Patient connected to face mask oxygen  Post-op Assessment: Report given to RN and Post -op Vital signs reviewed and stable  Post vital signs: Reviewed  Last Vitals:  Vitals Value Taken Time  BP 93/68 07/20/2018  9:33 AM  Temp    Pulse 52 07/20/2018  9:33 AM  Resp 12 07/20/2018  9:33 AM  SpO2 100 % 07/20/2018  9:33 AM  Vitals shown include unvalidated device data.  Last Pain:  Vitals:   07/20/18 0629  TempSrc: Tympanic  PainSc: 0-No pain      Patients Stated Pain Goal: 0 (58/30/74 6002)  Complications: No apparent anesthesia complications

## 2018-07-23 LAB — SURGICAL PATHOLOGY

## 2020-03-06 ENCOUNTER — Emergency Department: Payer: BC Managed Care – PPO

## 2020-03-06 ENCOUNTER — Other Ambulatory Visit: Payer: Self-pay

## 2020-03-06 ENCOUNTER — Emergency Department
Admission: EM | Admit: 2020-03-06 | Discharge: 2020-03-06 | Disposition: A | Discharge status: Home / Self Care | Payer: BC Managed Care – PPO | Attending: Emergency Medicine | Admitting: Emergency Medicine

## 2020-03-06 ENCOUNTER — Encounter: Payer: Self-pay | Admitting: Emergency Medicine

## 2020-03-06 DIAGNOSIS — F419 Anxiety disorder, unspecified: Secondary | ICD-10-CM | POA: Diagnosis not present

## 2020-03-06 DIAGNOSIS — F410 Panic disorder [episodic paroxysmal anxiety] without agoraphobia: Secondary | ICD-10-CM | POA: Diagnosis present

## 2020-03-06 DIAGNOSIS — R42 Dizziness and giddiness: Secondary | ICD-10-CM | POA: Insufficient documentation

## 2020-03-06 LAB — CBC
HCT: 39.5 % (ref 36.0–46.0)
Hemoglobin: 13.8 g/dL (ref 12.0–15.0)
MCH: 31 pg (ref 26.0–34.0)
MCHC: 34.9 g/dL (ref 30.0–36.0)
MCV: 88.8 fL (ref 80.0–100.0)
Platelets: 214 10*3/uL (ref 150–400)
RBC: 4.45 MIL/uL (ref 3.87–5.11)
RDW: 12.1 % (ref 11.5–15.5)
WBC: 9.8 10*3/uL (ref 4.0–10.5)
nRBC: 0 % (ref 0.0–0.2)

## 2020-03-06 LAB — BASIC METABOLIC PANEL
Anion gap: 13 (ref 5–15)
BUN: 8 mg/dL (ref 6–20)
CO2: 21 mmol/L — ABNORMAL LOW (ref 22–32)
Calcium: 9.6 mg/dL (ref 8.9–10.3)
Chloride: 103 mmol/L (ref 98–111)
Creatinine, Ser: 0.66 mg/dL (ref 0.44–1.00)
GFR calc Af Amer: >60 mL/min (ref >60)
GFR calc non Af Amer: >60 mL/min (ref >60)
Glucose, Bld: 102 mg/dL — ABNORMAL HIGH (ref 70–99)
Potassium: 3.7 mmol/L (ref 3.5–5.1)
Sodium: 137 mmol/L (ref 135–145)

## 2020-03-06 LAB — TROPONIN I (HIGH SENSITIVITY): Troponin I (High Sensitivity): <2 ng/L (ref <18)

## 2020-03-06 MED ORDER — SODIUM CHLORIDE 0.9% FLUSH: 3.0000 mL | Freq: Once | INTRAVENOUS | Status: DC

## 2020-03-06 NOTE — ED Notes (Signed)
Pt states she is feeling better and is requesting time for DC. VSS, NAD noted.

## 2020-03-06 NOTE — ED Triage Notes (Signed)
C/O 'feeling weird' this morning... arms, hands, feet feeling pins and needles, felt palpitations.    AAOx3.  Skin warm and dry. NAD

## 2020-03-06 NOTE — ED Notes (Signed)
First Nurse Note: Pt to ED via [facility] for numbness in her legs. Pt reported feeling dizzy. Per EMS pt was having a panic attack when they arrived. Pt is in NAD.   BP: 100/60

## 2020-03-06 NOTE — ED Provider Notes (Signed)
[facility] Emergency Department Provider Note   ____________________________________________   I have reviewed the triage vital signs and the nursing notes.   HISTORY  Chief Complaint  Abnormal sensation   History limited by: Not Limited   HPI Meghan Jacobson is a 39 y.o. female who presents to the emergency department today because of concerns for abnormal sensation.  The patient states that her symptoms started this morning.  She describes a sensation of feeling like she was on a roller coaster.  She denies the room spinning around her but had a sensation of motion sickness in her head.  She states that after the sensation started she did start to feel like she might be having a panic attack.  Patient does have a history of anxiety.  She states that at the time of my exam she is feeling better.  Patient denies any chest pain or palpitations while this was going on.   Records reviewed. Per medical record review patient has a history of anxiety.   Past Medical History:  Diagnosis Date  . Anxiety   . PONV (postoperative nausea and vomiting)    NAUSEA  . Umbilical hernia     There are no problems to display for this patient.   Past Surgical History:  Procedure Laterality Date  . EYE SURGERY Left 2018   CORRECTIVE EYE SURGERY  . LAPAROSCOPIC BILATERAL SALPINGECTOMY Bilateral 07/20/2018   Procedure: LAPAROSCOPIC BILATERAL SALPINGECTOMY;  Surgeon: Benjaman Kindler, MD;  Location: [facility] ORS;  Service: Gynecology;  Laterality: Bilateral;  . LAPAROSCOPIC HYSTERECTOMY N/A 07/20/2018   Procedure: HYSTERECTOMY TOTAL LAPAROSCOPIC;  Surgeon: Benjaman Kindler, MD;  Location: [facility] ORS;  Service: Gynecology;  Laterality: N/A;    Prior to Admission medications   Medication Sig Start Date End Date Taking? Authorizing Provider  docusate sodium (COLACE) 100 MG capsule Take 1 capsule (100 mg total) by mouth 2 (two) times daily. To keep stools soft 07/20/18   Benjaman Kindler, MD  gabapentin (NEURONTIN) 800 MG tablet Take 1 tablet (800 mg total) by mouth at bedtime for 14 days. Take nightly for 3 days, then up to 14 days as needed 07/20/18 08/03/18  Benjaman Kindler, MD  oxycodone (OXY-IR) 5 MG capsule Take 1 capsule (5 mg total) by mouth every 6 (six) hours as needed for pain. 07/20/18   Benjaman Kindler, MD    Allergies Patient has no known allergies.  No family history on file.  Social History Social History   Tobacco Use  . Smoking status: Never Smoker  . Smokeless tobacco: Never Used  Vaping Use  . Vaping Use: Never used  Substance Use Topics  . Alcohol use: Yes    Comment: WINE 2 X WEEKLY  . Drug use: Never    Review of Systems Constitutional: No fever/chills Eyes: No visual changes. ENT: No sore throat. Cardiovascular: Denies chest pain. Respiratory: Denies shortness of breath. Gastrointestinal: No abdominal pain.  No nausea, no vomiting.  No diarrhea.   Genitourinary: Negative for dysuria. Musculoskeletal: Negative for back pain. Skin: Negative for rash. Neurological: Positive for tingling sensation.  ____________________________________________   PHYSICAL EXAM:  VITAL SIGNS: ED Triage Vitals  Enc Vitals Group     BP 03/06/20 1450 109/79     Pulse Rate 03/06/20 1450 69     Resp 03/06/20 1450 16     Temp 03/06/20 1450 98.5 F (36.9 C)     Temp Source 03/06/20 1450 Oral     SpO2 03/06/20 1450 100 %  Weight 03/06/20 1458 156 lb 15.5 oz (71.2 kg)     Height 03/06/20 1458 5\' 9"  (1.753 m)     Head Circumference --      Peak Flow --      Pain Score 03/06/20 1458 0   Constitutional: Alert and oriented.  Eyes: Conjunctivae are normal.  ENT      Head: Normocephalic and atraumatic.      Nose: No congestion/rhinnorhea.      Mouth/Throat: Mucous membranes are moist.      Neck: No stridor. Hematological/Lymphatic/Immunilogical: No cervical lymphadenopathy. Cardiovascular: Normal rate, regular rhythm.  No murmurs, rubs,  or gallops.  Respiratory: Normal respiratory effort without tachypnea nor retractions. Breath sounds are clear and equal bilaterally. No wheezes/rales/rhonchi. Gastrointestinal: Soft and non tender. No rebound. No guarding.  Genitourinary: Deferred Musculoskeletal: Normal range of motion in all extremities. No lower extremity edema. Neurologic:  Normal speech and language. No gross focal neurologic deficits are appreciated.  Skin:  Skin is warm, dry and intact. No rash noted. Psychiatric: Mood and affect are normal. Speech and behavior are normal. Patient exhibits appropriate insight and judgment.  ____________________________________________    LABS (pertinent positives/negatives)  CBC wbc 9.8, hgb 13.8, plt 214 Trop hs <2  BMP wnl except co2 21, glu 102 ____________________________________________   EKG  I, Nance Pear, attending physician, personally viewed and interpreted this EKG  EKG Time: 1450 Rate: 71 Rhythm: normal sinus rhythm Axis: normal Intervals: qtc 452 QRS: narrow, q waves III, avf, V4-V6 ST changes: no st elevation Impression: abnormal ekg  ____________________________________________    RADIOLOGY  CXR No acute abnormality  ____________________________________________   PROCEDURES  Procedures  ____________________________________________   INITIAL IMPRESSION / ASSESSMENT AND PLAN / ED COURSE  Pertinent labs & imaging results that were available during my care of the patient were reviewed by me and considered in my medical decision making (see chart for details).   Patient presents to the emergency department today with concerns for abnormal sensations that started today.  By the time my exam she had felt better.  Work-up here without concerning blood work abnormality.  Given that she feels improved do not feel any emergent neuroimaging is necessary.  I doubt significant intracranial bleed or mass.  I doubt MS.  Additionally I doubt cardiac  etiology.  This point is somewhat unclear etiology.  Did discuss with patient portance of follow-up.  ___________________________________________   FINAL CLINICAL IMPRESSION(S) / ED DIAGNOSES  Final diagnoses:  Lightheadedness  Anxiety     Note: This dictation was prepared with Dragon dictation. Any transcriptional errors that result from this process are unintentional     Nance Pear, MD 03/06/20 2313

## 2020-03-06 NOTE — ED Notes (Signed)
Pt visualized sitting in stretcher in hallway. Husband bedside. This RN explained delay to pt and family member. No needs expressed at this time, NAD noted.

## 2020-03-06 NOTE — ED Notes (Signed)
ED Provider at bedside. 

## 2020-03-06 NOTE — Discharge Instructions (Addendum)
Please seek medical attention for any high fevers, chest pain, shortness of breath, change in behavior, persistent vomiting, bloody stool or any other new or concerning symptoms.  

## 2020-03-10 ENCOUNTER — Emergency Department: Payer: BC Managed Care – PPO

## 2020-03-10 ENCOUNTER — Emergency Department
Admission: EM | Admit: 2020-03-10 | Discharge: 2020-03-10 | Disposition: A | Discharge status: Home / Self Care | Payer: BC Managed Care – PPO | Attending: Emergency Medicine | Admitting: Emergency Medicine

## 2020-03-10 ENCOUNTER — Other Ambulatory Visit: Payer: Self-pay

## 2020-03-10 ENCOUNTER — Encounter: Payer: Self-pay | Admitting: Emergency Medicine

## 2020-03-10 DIAGNOSIS — R42 Dizziness and giddiness: Secondary | ICD-10-CM | POA: Diagnosis not present

## 2020-03-10 LAB — URINALYSIS, COMPLETE (UACMP) WITH MICROSCOPIC
Bacteria, UA: NONE SEEN
Bilirubin Urine: NEGATIVE
Glucose, UA: NEGATIVE mg/dL
Hgb urine dipstick: NEGATIVE
Ketones, ur: NEGATIVE mg/dL
Leukocytes,Ua: NEGATIVE
Nitrite: NEGATIVE
Protein, ur: NEGATIVE mg/dL
Specific Gravity, Urine: 1.004 — ABNORMAL LOW (ref 1.005–1.030)
pH: 7 (ref 5.0–8.0)

## 2020-03-10 LAB — BASIC METABOLIC PANEL
Anion gap: 10 (ref 5–15)
BUN: 10 mg/dL (ref 6–20)
CO2: 24 mmol/L (ref 22–32)
Calcium: 9.3 mg/dL (ref 8.9–10.3)
Chloride: 102 mmol/L (ref 98–111)
Creatinine, Ser: 0.71 mg/dL (ref 0.44–1.00)
GFR calc Af Amer: >60 mL/min (ref >60)
GFR calc non Af Amer: >60 mL/min (ref >60)
Glucose, Bld: 103 mg/dL — ABNORMAL HIGH (ref 70–99)
Potassium: 4.1 mmol/L (ref 3.5–5.1)
Sodium: 136 mmol/L (ref 135–145)

## 2020-03-10 LAB — CBC
HCT: 42.6 % (ref 36.0–46.0)
Hemoglobin: 14.7 g/dL (ref 12.0–15.0)
MCH: 30.8 pg (ref 26.0–34.0)
MCHC: 34.5 g/dL (ref 30.0–36.0)
MCV: 89.1 fL (ref 80.0–100.0)
Platelets: 251 10*3/uL (ref 150–400)
RBC: 4.78 MIL/uL (ref 3.87–5.11)
RDW: 12.2 % (ref 11.5–15.5)
WBC: 6.3 10*3/uL (ref 4.0–10.5)
nRBC: 0 % (ref 0.0–0.2)

## 2020-03-10 MED ORDER — SODIUM CHLORIDE 0.9% FLUSH: 3.0000 mL | Freq: Once | INTRAVENOUS | Status: DC

## 2020-03-10 MED ORDER — MECLIZINE HCL 25 MG PO TABS
25.0000 mg | ORAL_TABLET | Freq: Three times a day (TID) | ORAL | 0 refills | Status: AC | PRN
Start: 2020-03-10 — End: ?

## 2020-03-10 MED ORDER — MECLIZINE HCL 25 MG PO TABS
25.0000 mg | ORAL_TABLET | Freq: Once | ORAL | Status: AC
  Administered 2020-03-10: 25 mg via ORAL
  Filled 2020-03-10: qty 1

## 2020-03-10 NOTE — ED Provider Notes (Signed)
[facility] Emergency Department Provider Note  Time seen: 2:18 PM  I have reviewed the triage vital signs and the nursing notes.   HISTORY  Chief Complaint Dizziness   HPI Meghan Jacobson is a 39 y.o. female with a past medical history of anxiety presents to the emergency department for dizziness.  According to the patient over the past few days she has been experiencing episodes of severe dizziness where she states starts as a spinning sensation and then a lightheaded sensation and states often times her anxiety kicks in and she feels like she is panicking.  Patient denies any headache at any point.  Patient does state occasional ringing in the ears.  Went to urgent care and was told she had fluid behind her ears but wanted her seen in the emergency department as a precaution.  Patient denies any weakness or numbness denies any headaches.   Past Medical History:  Diagnosis Date  . Anxiety   . PONV (postoperative nausea and vomiting)    NAUSEA  . Umbilical hernia     There are no problems to display for this patient.   Past Surgical History:  Procedure Laterality Date  . EYE SURGERY Left 2018   CORRECTIVE EYE SURGERY  . LAPAROSCOPIC BILATERAL SALPINGECTOMY Bilateral 07/20/2018   Procedure: LAPAROSCOPIC BILATERAL SALPINGECTOMY;  Surgeon: Benjaman Kindler, MD;  Location: [facility] ORS;  Service: Gynecology;  Laterality: Bilateral;  . LAPAROSCOPIC HYSTERECTOMY N/A 07/20/2018   Procedure: HYSTERECTOMY TOTAL LAPAROSCOPIC;  Surgeon: Benjaman Kindler, MD;  Location: [facility] ORS;  Service: Gynecology;  Laterality: N/A;    Prior to Admission medications   Medication Sig Start Date End Date Taking? Authorizing Provider  docusate sodium (COLACE) 100 MG capsule Take 1 capsule (100 mg total) by mouth 2 (two) times daily. To keep stools soft 07/20/18   Benjaman Kindler, MD  gabapentin (NEURONTIN) 800 MG tablet Take 1 tablet (800 mg total) by mouth at bedtime for 14 days. Take  nightly for 3 days, then up to 14 days as needed 07/20/18 08/03/18  Benjaman Kindler, MD  oxycodone (OXY-IR) 5 MG capsule Take 1 capsule (5 mg total) by mouth every 6 (six) hours as needed for pain. 07/20/18   Benjaman Kindler, MD    No Known Allergies  History reviewed. No pertinent family history.  Social History Social History   Tobacco Use  . Smoking status: Never Smoker  . Smokeless tobacco: Never Used  Vaping Use  . Vaping Use: Never used  Substance Use Topics  . Alcohol use: Yes    Comment: WINE 2 X WEEKLY  . Drug use: Never    Review of Systems Constitutional: Negative for fever.  Intermittent dizziness. Cardiovascular: Negative for chest pain. Respiratory: Negative for shortness of breath. Gastrointestinal: Negative for abdominal pain Musculoskeletal: Negative for musculoskeletal complaints Neurological: Negative for headache All other ROS negative  ____________________________________________   PHYSICAL EXAM:  VITAL SIGNS: ED Triage Vitals  Enc Vitals Group     BP 03/10/20 1202 110/71     Pulse Rate 03/10/20 1202 64     Resp 03/10/20 1202 18     Temp 03/10/20 1202 98.1 F (36.7 C)     Temp Source 03/10/20 1202 Oral     SpO2 03/10/20 1202 100 %     Weight --      Height --      Head Circumference --      Peak Flow --      Pain Score 03/10/20 1209 0  Pain Loc --      Pain Edu? --      Excl. in [place]? --    Constitutional: Alert and oriented. Well appearing and in no distress. Eyes: Normal exam ENT      Head: Normocephalic and atraumatic.  Patient does appear to have a small amount of fluid behind each eardrum but no erythema or other signs of otitis media.      Mouth/Throat: Mucous membranes are moist. Cardiovascular: Normal rate, regular rhythm. No murmurs, rubs, or gallops. Respiratory: Normal respiratory effort without tachypnea nor retractions. Breath sounds are clear Gastrointestinal: Soft and nontender. No distention.   Musculoskeletal:  Nontender with normal range of motion in all extremities.  Neurologic:  Normal speech and language. No gross focal neurologic deficits.  Equal grip strengths.  Normal cranial nerves. Skin:  Skin is warm, dry and intact.  Psychiatric: Mood and affect are normal.   ____________________________________________    EKG  EKG viewed and interpreted by myself shows a normal sinus rhythm at 75 bpm with a narrow QRS, normal axis, normal intervals, no concerning ST changes.  ____________________________________________    RADIOLOGY  CT scan head is negative for acute abnormality.  ____________________________________________   INITIAL IMPRESSION / ASSESSMENT AND PLAN / ED COURSE  Pertinent labs & imaging results that were available during my care of the patient were reviewed by me and considered in my medical decision making (see chart for details).   Patient presents to the emergency department for intermittent dizziness and panic type symptoms.  According to the patient for the past several days she has been experiencing intermittent episodes of significant dizziness with spinning and then lightheadedness followed by almost a panic sensation per patient.  Patient admits to history of anxiety but does not take any medications at this time.  On physical exam patient has a very reassuring physical exam with normal neurological exam, patient does have a small amount of fluid behind each eardrum but no erythema no tenderness no signs of otitis media no bulging of the tympanic membrane.  Is a patient's work-up is reassuring including CT scan and lab work with a normal physical exam and reassuring vitals I believe the patient is safe for discharge home.  I do believe the patient would benefit from a trial of meclizine.  I will refer to ENT as well.  I discussed return precautions for any headache.  Patient agreeable to plan of care.  Meghan Jacobson was evaluated in Emergency Department on 03/10/2020 for the  symptoms described in the history of present illness. She was evaluated in the context of the global COVID-19 pandemic, which necessitated consideration that the patient might be at risk for infection with the SARS-CoV-2 virus that causes COVID-19. Institutional protocols and algorithms that pertain to the evaluation of patients at risk for COVID-19 are in a state of rapid change based on information released by regulatory bodies including the CDC and federal and state organizations. These policies and algorithms were followed during the patient's care in the ED.  ____________________________________________   FINAL CLINICAL IMPRESSION(S) / ED DIAGNOSES  Dizziness   Harvest Dark, MD 03/10/20 1421

## 2020-03-10 NOTE — ED Triage Notes (Signed)
Pt via pov from UC with continued lightheadedness and dizziness since last Friday. She was seen here for the same but it has not resolved. She went to the UC today and they sent her here for a workup. Pt states that she was evaluated for a panic attack on Friday and feels like there is something more going on. NAD noted.

## 2020-08-04 ENCOUNTER — Ambulatory Visit: Payer: Self-pay | Admitting: Family Medicine

## 2021-07-13 ENCOUNTER — Other Ambulatory Visit: Payer: Self-pay | Admitting: Neurology

## 2021-07-13 DIAGNOSIS — G43809 Other migraine, not intractable, without status migrainosus: Secondary | ICD-10-CM

## 2021-07-20 ENCOUNTER — Ambulatory Visit
Admission: RE | Admit: 2021-07-20 | Discharge: 2021-07-20 | Disposition: A | Discharge status: Home / Self Care | Payer: BC Managed Care – PPO | Source: Ambulatory Visit | Attending: Neurology | Admitting: Neurology

## 2021-07-20 ENCOUNTER — Other Ambulatory Visit: Payer: Self-pay

## 2021-07-20 DIAGNOSIS — G43809 Other migraine, not intractable, without status migrainosus: Secondary | ICD-10-CM | POA: Insufficient documentation

## 2021-07-20 MED ORDER — GADOBUTROL 1 MMOL/ML IV SOLN
7.0000 mL | Freq: Once | INTRAVENOUS | Status: AC | PRN
  Administered 2021-07-20: 7 mL via INTRAVENOUS

## 2021-08-11 IMAGING — CT CT HEAD W/O CM
3 series · 16 of 47 positions shown, 19 images · non-contrast
Comparison: No pertinent prior studies available for comparison.

CLINICAL DATA: Dizziness, nonspecific. Additional provided:
Lightheadedness and dizziness.

EXAM:
CT HEAD WITHOUT CONTRAST
TECHNIQUE: Contiguous axial images were obtained from the base of the skull
through the vertex without intravenous contrast.

[Series 2: head wo · axial · 0.42mm/px · z∈[+276,+401]mm · 10 of 30 slices shown, 13 images]
[im 3/30  brain]
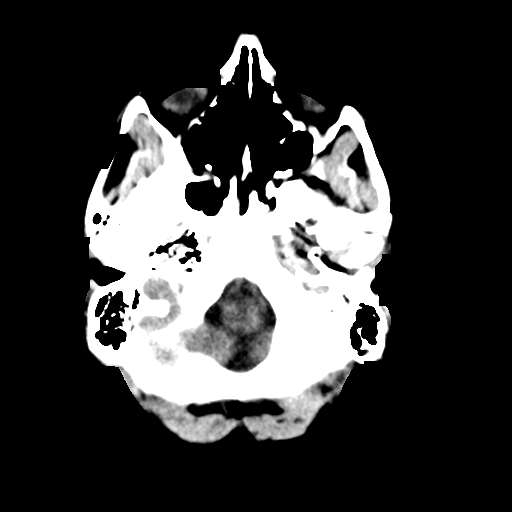
[im 3/30  bone]
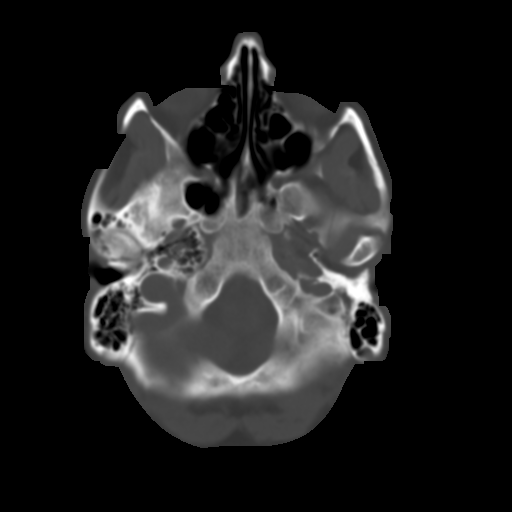
[im 6/30  brain]
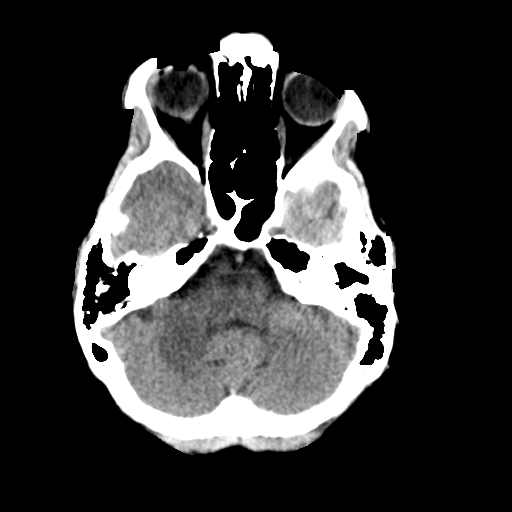
[im 9/30  brain]
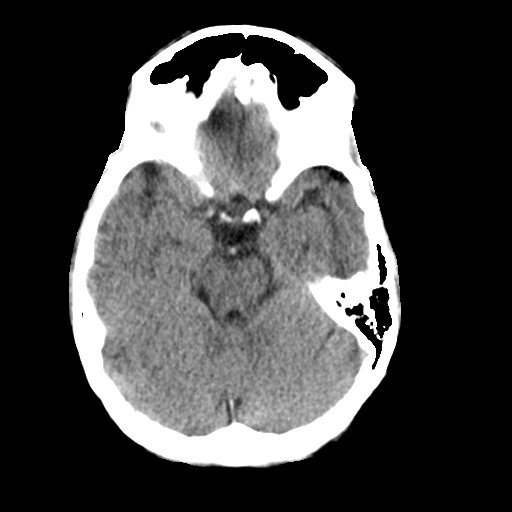
[im 11/30  brain]
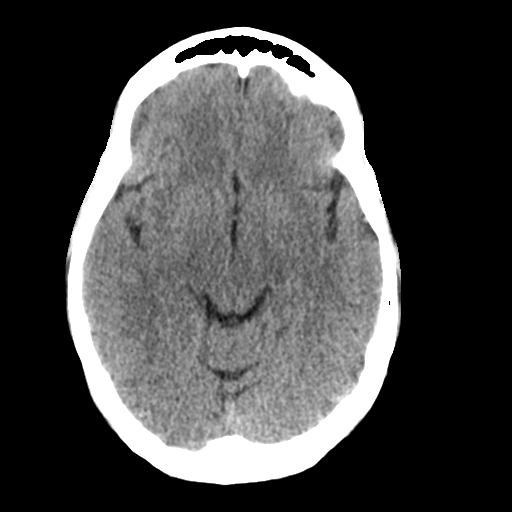
[im 14/30  brain]
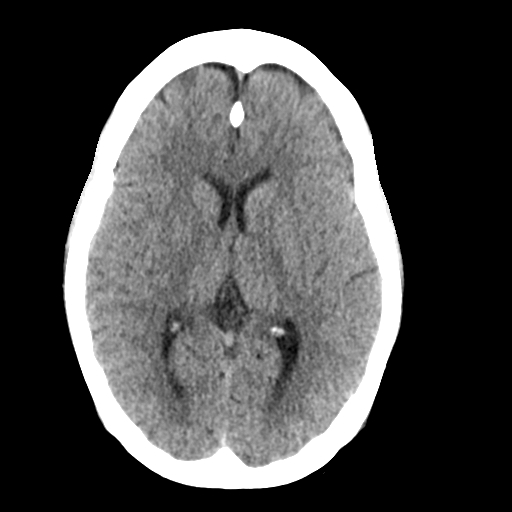
[im 14/30  bone]
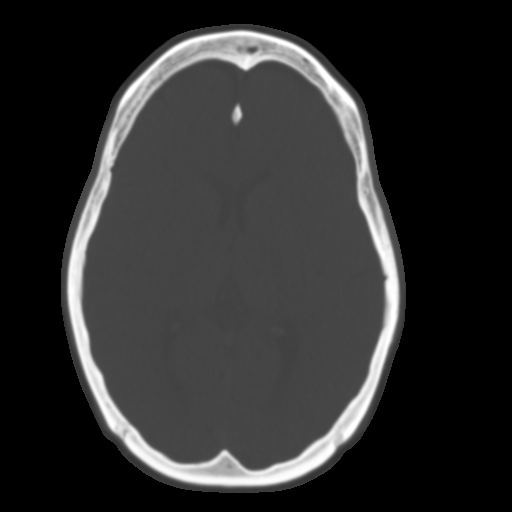
[im 17/30  brain]
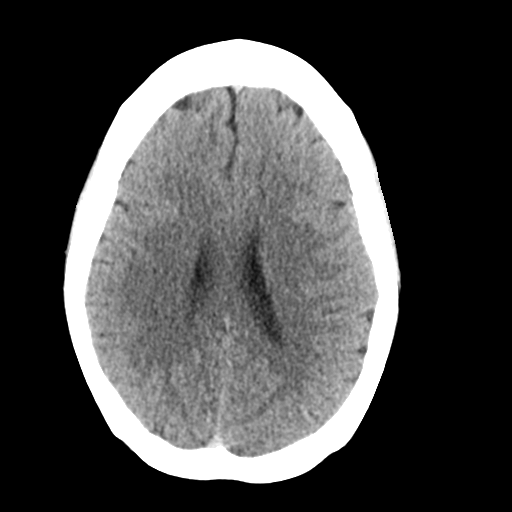
[im 20/30  brain]
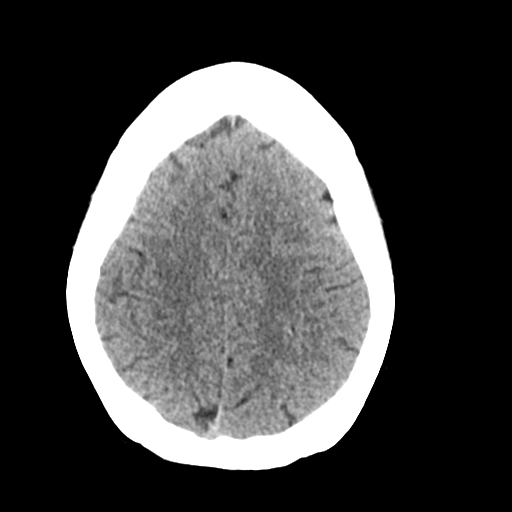
[im 23/30  brain]
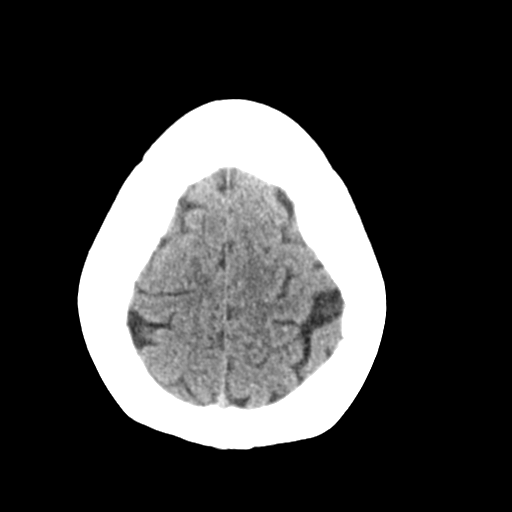
[im 25/30  brain]
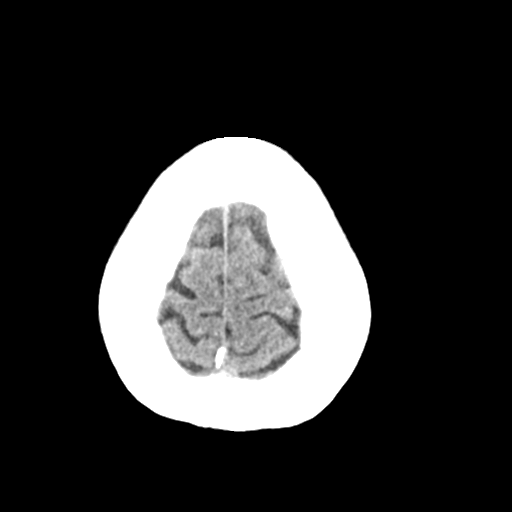
[im 25/30  bone]
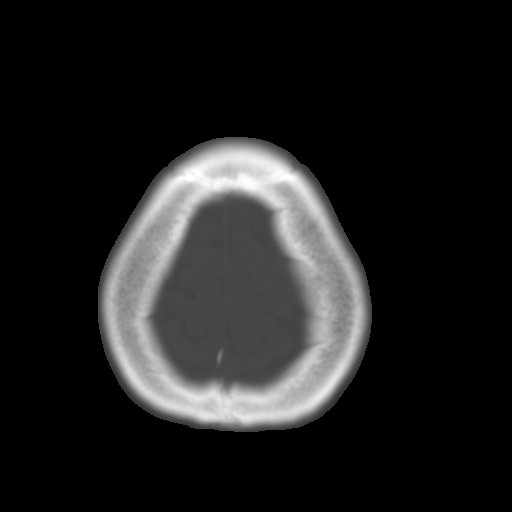
[im 28/30  brain]
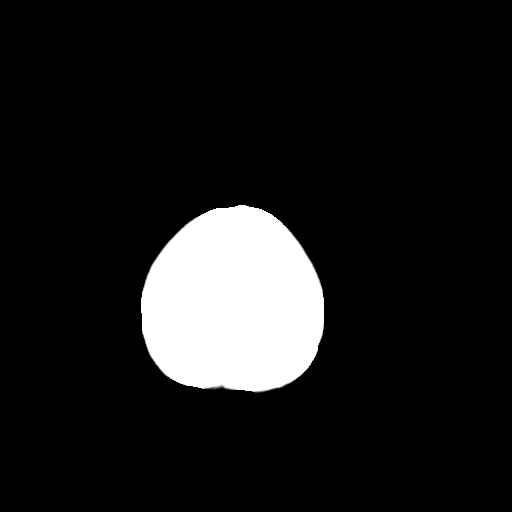

[Series 4: coronal soft tissue · coronal · 0.31mm/px · 3 of 68 slices shown]
[im 23/68  brain]
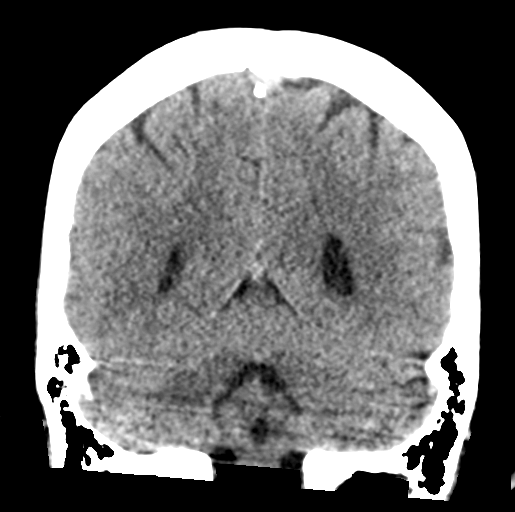
[im 30/68  brain]
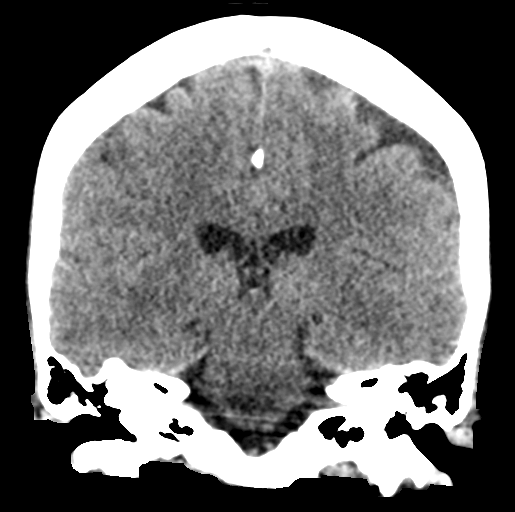
[im 38/68  brain]
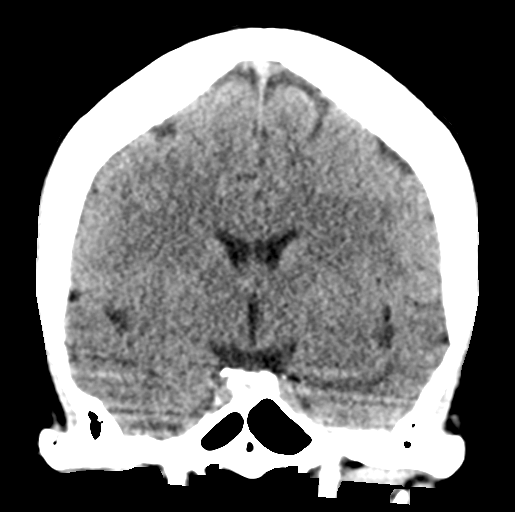

[Series 5: sagittal soft tissue · sagittal · 0.33mm/px · 3 of 50 slices shown]
[im 17/50  brain]
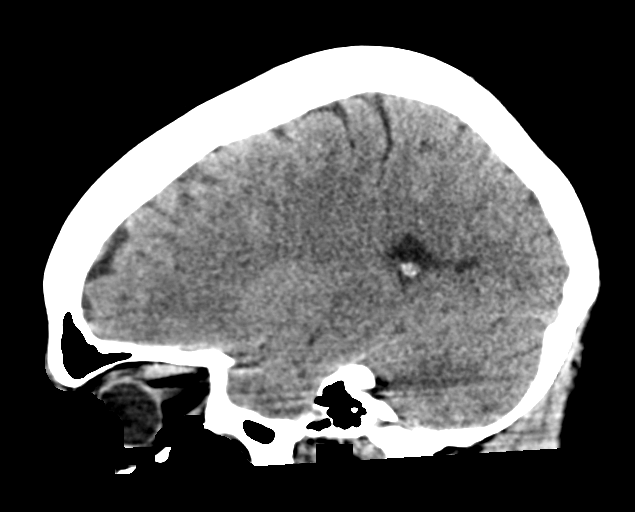
[im 25/50  brain]
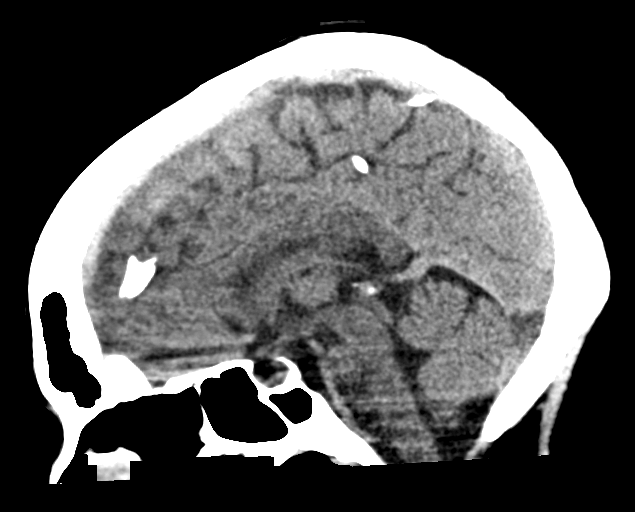
[im 33/50  brain]
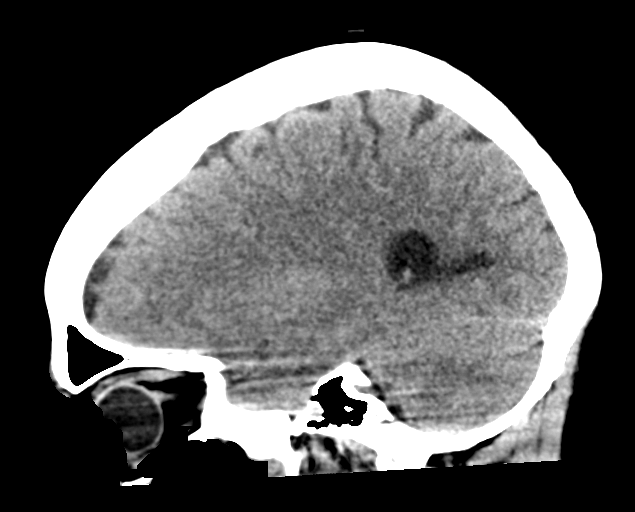

[16 of 47 positions shown; findings below may reference images not displayed]

FINDINGS: Brain:

Cerebral volume is normal.

There is no acute intracranial hemorrhage.

No demarcated cortical infarct.

No extra-axial fluid collection.

No evidence of intracranial mass.

No midline shift.

Partially empty sella turcica.

Vascular: No hyperdense vessel.

Skull: Normal. Negative for fracture or focal lesion. Visualized
orbits show no acute finding.

Sinuses/Orbits: Visualized orbits show no acute finding. No
significant paranasal sinus disease or mastoid effusion at the
imaged levels.
IMPRESSION: No evidence of acute intracranial abnormality.

Partially empty sella turcica. This is very commonly an incidental
finding, but can be associated with idiopathic intracranial
hypertension.

Otherwise unremarkable non-contrast CT appearance of the brain.

## 2024-01-05 ENCOUNTER — Telehealth: Payer: Self-pay

## 2024-01-05 NOTE — Telephone Encounter (Signed)
 The patient called requesting to schedule a procedure, stating that she received a message indicating her referral had been sent to us . I reviewed the referral and informed her that it did not specify a colonoscopy, but instead noted GI symptoms.  I also informed the patient that she is listed as an established patient with [facility] and has been seen by Dr. Emerick Hanlon approximately six months ago. The patient stated she had never been there, but I clarified that our records indicate a past visit.  I advised her that it would be best to contact [facility] for further assistance. She agreed and said she will reach out to them.
